# Patient Record
Sex: Male | Born: 1985 | Race: Black or African American | Hispanic: No | Marital: Married | Smoking: Current every day smoker
Health system: Southern US, Community
[De-identification: ages and names within clinical notes are randomized; demographics above are authoritative.]

---
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
## 2006-06-21 ENCOUNTER — Emergency Department (HOSPITAL_COMMUNITY): Admission: EM | Admit: 2006-06-21 | Discharge: 2006-06-22 | Payer: Self-pay | Admitting: Emergency Medicine
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
## 2008-10-21 ENCOUNTER — Emergency Department (HOSPITAL_COMMUNITY): Admission: EM | Admit: 2008-10-21 | Discharge: 2008-10-21 | Payer: Self-pay | Admitting: Family Medicine

## 2009-03-13 ENCOUNTER — Emergency Department (HOSPITAL_COMMUNITY): Admission: EM | Admit: 2009-03-13 | Discharge: 2009-03-13 | Payer: Self-pay | Admitting: Family Medicine

## 2009-08-22 ENCOUNTER — Emergency Department (HOSPITAL_COMMUNITY): Admission: EM | Admit: 2009-08-22 | Discharge: 2009-08-22 | Payer: Self-pay | Admitting: Emergency Medicine

## 2011-08-09 LAB — GC/CHLAMYDIA PROBE AMP, GENITAL: GC Probe Amp, Genital: POSITIVE — AB

## 2016-01-05 DIAGNOSIS — F1399 Sedative, hypnotic or anxiolytic use, unspecified with unspecified sedative, hypnotic or anxiolytic-induced disorder: Secondary | ICD-10-CM | POA: Insufficient documentation

## 2016-01-30 DIAGNOSIS — G4700 Insomnia, unspecified: Secondary | ICD-10-CM | POA: Insufficient documentation

## 2021-12-31 ENCOUNTER — Other Ambulatory Visit: Payer: Self-pay

## 2021-12-31 ENCOUNTER — Ambulatory Visit (INDEPENDENT_AMBULATORY_CARE_PROVIDER_SITE_OTHER): Admitting: Nurse Practitioner

## 2021-12-31 ENCOUNTER — Encounter: Payer: Self-pay | Admitting: Nurse Practitioner

## 2021-12-31 VITALS — BP 112/76 | HR 89 | Resp 16 | Ht 65.0 in | Wt 143.0 lb

## 2021-12-31 DIAGNOSIS — M62830 Muscle spasm of back: Secondary | ICD-10-CM | POA: Insufficient documentation

## 2021-12-31 DIAGNOSIS — Z7689 Persons encountering health services in other specified circumstances: Secondary | ICD-10-CM | POA: Diagnosis not present

## 2021-12-31 MED ORDER — TIZANIDINE HCL 4 MG PO TABS: 4.0000 mg | ORAL_TABLET | Freq: Four times a day (QID) | ORAL | 0 refills | Status: AC | PRN

## 2021-12-31 MED ORDER — CETIRIZINE HCL 10 MG PO TABS: 10.0000 mg | ORAL_TABLET | Freq: Every day | ORAL | 11 refills | Status: AC

## 2021-12-31 NOTE — Progress Notes (Signed)
@Patient  ID: , male    DOB: January 29, 1986, 36 y.o.   MRN: [id number]  Chief Complaint  Patient presents with   Establish Care    Referring provider: No ref. provider found   HPI  Patient presents today to establish care.  Patient states that he has a history of ADHD and has been on Adderall in the past.  Patient states that he has been out of this medication for about the past 6 months.  Patient states that he does have a new job and is finding it difficult to concentrate at work.  Patient does have a history of back spasms and lumbar DDD.  Patient needs refills on his prescription for tizanidine to have as needed for back spasms.  Patient does have a history of bilateral ear impactions.  He states that he would like to have his ears checked today.  Upon exam he does have impacted cerumen to bilateral ears.  Patient states that he cannot wait today to get a ear wash so he will return Wednesday this week to have this done as a nurse visit.  Denies f/c/s, n/v/d, hemoptysis, PND, chest pain or edema.    No Known Allergies   There is no immunization history on file for this patient.  History reviewed. No pertinent past medical history.  Tobacco History: Social History   Tobacco Use  Smoking Status Every Day   Types: Cigarettes  Smokeless Tobacco Never   Ready to quit: Not Answered Counseling given: Not Answered   Outpatient Encounter Medications as of 12/31/2021  Medication Sig   amphetamine-dextroamphetamine (ADDERALL XR) 30 MG 24 hr capsule dextroamphetamine-amphetamine 30 mg oral capsule, extended release Start Date: 07/16/19 Status: Ordered   cetirizine (ZYRTEC) 10 MG tablet Take 1 tablet (10 mg total) by mouth daily.   zolpidem (AMBIEN) 10 MG tablet zolpidem 10 mg oral tablet Start Date: 07/09/19 Status: Ordered   [DISCONTINUED] tiZANidine (ZANAFLEX) 4 MG tablet Take 4 mg by mouth every 6 (six) hours as needed.   tiZANidine (ZANAFLEX) 4 MG tablet Take 1  tablet (4 mg total) by mouth every 6 (six) hours as needed.   No facility-administered encounter medications on file as of 12/31/2021.     Review of Systems  Review of Systems  Constitutional: Negative.   HENT:  Positive for ear pain.   Cardiovascular: Negative.   Gastrointestinal: Negative.   Allergic/Immunologic: Negative.   Neurological: Negative.   Psychiatric/Behavioral: Negative.        Physical Exam  BP 112/76    Pulse 89    Resp 16    Ht 5\' 5"  (1.651 m)    Wt 143 lb (64.9 kg)    SpO2 97%    BMI 23.80 kg/m   Wt Readings from Last 5 Encounters:  12/31/21 143 lb (64.9 kg)     Physical Exam Vitals and nursing note reviewed.  Constitutional:      General: He is not in acute distress.    Appearance: He is well-developed.  Cardiovascular:     Rate and Rhythm: Normal rate and regular rhythm.  Pulmonary:     Effort: Pulmonary effort is normal.     Breath sounds: Normal breath sounds.  Skin:    General: Skin is warm and dry.  Neurological:     Mental Status: He is alert and oriented to person, place, and time.       Assessment & Plan:   Encounter to establish care Bilateral ear impaction ADHD Back spasm:  Meds ordered this encounter  Medications   tiZANidine (ZANAFLEX) 4 MG tablet    Sig: Take 1 tablet (4 mg total) by mouth every 6 (six) hours as needed.    Dispense:  30 tablet    Refill:  0   cetirizine (ZYRTEC) 10 MG tablet    Sig: Take 1 tablet (10 mg total) by mouth daily.    Dispense:  30 tablet    Refill:  11   Will order ear wash  Stay well hydrated  Stay active  Deep breathing exercises  May take tylenol or fever or pain   Follow up:  Follow up in 2 weeks for ADHD     Ivonne Andrew, NP 12/31/2021

## 2021-12-31 NOTE — Assessment & Plan Note (Signed)
Bilateral ear impaction ADHD Back spasm:  Meds ordered this encounter  Medications   tiZANidine (ZANAFLEX) 4 MG tablet    Sig: Take 1 tablet (4 mg total) by mouth every 6 (six) hours as needed.    Dispense:  30 tablet    Refill:  0   cetirizine (ZYRTEC) 10 MG tablet    Sig: Take 1 tablet (10 mg total) by mouth daily.    Dispense:  30 tablet    Refill:  11   Will order ear wash  Stay well hydrated  Stay active  Deep breathing exercises  May take tylenol or fever or pain   Follow up:  Follow up in 2 weeks for ADHD

## 2021-12-31 NOTE — Patient Instructions (Addendum)
Encounter to establish care Bilateral ear impaction ADHD Back spasm:  Meds ordered this encounter  Medications   tiZANidine (ZANAFLEX) 4 MG tablet    Sig: Take 1 tablet (4 mg total) by mouth every 6 (six) hours as needed.    Dispense:  30 tablet    Refill:  0   cetirizine (ZYRTEC) 10 MG tablet    Sig: Take 1 tablet (10 mg total) by mouth daily.    Dispense:  30 tablet    Refill:  11   Will order ear wash  Stay well hydrated  Stay active  Deep breathing exercises  May take tylenol or fever or pain   Follow up:  Follow up in 2 weeks for ADHD

## 2022-01-07 ENCOUNTER — Other Ambulatory Visit: Payer: Self-pay

## 2022-01-07 ENCOUNTER — Encounter: Payer: Self-pay | Admitting: Family Medicine

## 2022-01-07 ENCOUNTER — Ambulatory Visit (INDEPENDENT_AMBULATORY_CARE_PROVIDER_SITE_OTHER): Admitting: Family Medicine

## 2022-01-07 VITALS — BP 127/84 | HR 67 | Temp 98.2°F | Resp 16 | Ht 65.0 in | Wt 140.6 lb

## 2022-01-07 DIAGNOSIS — F411 Generalized anxiety disorder: Secondary | ICD-10-CM

## 2022-01-07 DIAGNOSIS — F988 Other specified behavioral and emotional disorders with onset usually occurring in childhood and adolescence: Secondary | ICD-10-CM

## 2022-01-07 DIAGNOSIS — F17210 Nicotine dependence, cigarettes, uncomplicated: Secondary | ICD-10-CM | POA: Diagnosis not present

## 2022-01-07 DIAGNOSIS — F17200 Nicotine dependence, unspecified, uncomplicated: Secondary | ICD-10-CM

## 2022-01-07 DIAGNOSIS — G4700 Insomnia, unspecified: Secondary | ICD-10-CM

## 2022-01-07 MED ORDER — TRAZODONE HCL 100 MG PO TABS: 100.0000 mg | ORAL_TABLET | Freq: Every evening | ORAL | 0 refills | Status: DC | PRN

## 2022-01-07 MED ORDER — AMPHETAMINE-DEXTROAMPHET ER 30 MG PO CP24: ORAL_CAPSULE | ORAL | 0 refills | Status: DC

## 2022-01-07 NOTE — Progress Notes (Signed)
Patient is here to est care with provider. Patient was recently in the [employer] and has since been D/C ? ?Patient said he has disc disease.  ? ?Patient would like a medication refill  ?

## 2022-01-08 ENCOUNTER — Encounter: Payer: Self-pay | Admitting: Family Medicine

## 2022-01-08 NOTE — Progress Notes (Signed)
? ?Established Patient Office Visit ? ?Subjective:  ?Patient ID: Chad Glenn, male    DOB: [birth year]  Age: 36 y.o. MRN: [id number] ? ?CC:  ?Chief Complaint  ?Patient presents with  ? Medication Refill  ? Depression  ? Ear Fullness  ? ? ?HPI ?Chad Glenn presents for review of chronic med issues including insomnia, anxiety and ADD.  ? ?No past medical history on file. ? ?No past surgical history on file. ? ?Family History  ?Problem Relation Age of Onset  ? Hypertension Mother   ? Hypertension Father   ? Heart failure Maternal Grandfather   ? Heart failure Paternal Grandmother   ? ? ?Social History  ? ?Socioeconomic History  ? Marital status: Married  ?  Spouse name: Not on file  ? Number of children: Not on file  ? Years of education: Not on file  ? Highest education level: Not on file  ?Occupational History  ? Not on file  ?Tobacco Use  ? Smoking status: Every Day  ?  Types: Cigarettes  ? Smokeless tobacco: Never  ?Vaping Use  ? Vaping Use: Never used  ?Substance and Sexual Activity  ? Alcohol use: Not Currently  ? Drug use: Not Currently  ? Sexual activity: Not on file  ?Other Topics Concern  ? Not on file  ?Social History Narrative  ? Not on file  ? ?Social Determinants of Health  ? ?Financial Resource Strain: Not on file  ?Food Insecurity: Not on file  ?Transportation Needs: Not on file  ?Physical Activity: Not on file  ?Stress: Not on file  ?Social Connections: Not on file  ?Intimate Partner Violence: Not on file  ? ? ?ROS ?Review of Systems  ?Psychiatric/Behavioral:  Positive for decreased concentration and sleep disturbance. Negative for self-injury and suicidal ideas. The patient is nervous/anxious.   ?All other systems reviewed and are negative. ? ?Objective:  ? ?Today's Vitals: BP 127/84   Pulse 67   Temp 98.2 ?F (36.8 ?C) (Oral)   Resp 16   Ht 5\' 5"  (1.651 m)   Wt 140 lb 9.6 oz (63.8 kg)   SpO2 98%   BMI 23.40 kg/m?  ? ?Physical Exam ?Vitals and nursing note reviewed.  ?Constitutional:   ?    General: He is not in acute distress. ?Cardiovascular:  ?   Rate and Rhythm: Normal rate and regular rhythm.  ?Pulmonary:  ?   Effort: Pulmonary effort is normal.  ?   Breath sounds: Normal breath sounds.  ?Abdominal:  ?   Palpations: Abdomen is soft.  ?   Tenderness: There is no abdominal tenderness.  ?Neurological:  ?   General: No focal deficit present.  ?   Mental Status: He is alert and oriented to person, place, and time.  ?Psychiatric:     ?   Mood and Affect: Affect normal. Mood is anxious.     ?   Speech: Speech is rapid and pressured.     ?   Behavior: Behavior is hyperactive. Behavior is cooperative.  ? ? ?Assessment & Plan:  ? ?1. Insomnia, unspecified type ?Trazodone prescribed. Good sleep hygiene discussed.  ? ?2. Attention deficit disorder, unspecified hyperactivity presence ?Adderall prescribed. Will try to obtain records from previous providers.  ? ?3. Generalized anxiety disorder ?Will monitor for now.  ? ?4. Smoking ?Discussed reduction/cessation ? ? ?Outpatient Encounter Medications as of 01/07/2022  ?Medication Sig  ? cetirizine (ZYRTEC) 10 MG tablet Take 1 tablet (10 mg total) by mouth daily.  ?  clonazePAM (KLONOPIN) 1 MG tablet clonazePAM 1 mg oral tablet ?Start Date: 05/21/19 ?Status: Ordered  ? tiZANidine (ZANAFLEX) 4 MG tablet Take 1 tablet (4 mg total) by mouth every 6 (six) hours as needed.  ? traZODone (DESYREL) 100 MG tablet Take 1 tablet (100 mg total) by mouth at bedtime as needed for sleep.  ? zolpidem (AMBIEN) 10 MG tablet zolpidem 10 mg oral tablet ?Start Date: 07/09/19 ?Status: Ordered  ? [DISCONTINUED] amphetamine-dextroamphetamine (ADDERALL XR) 30 MG 24 hr capsule dextroamphetamine-amphetamine 30 mg oral capsule, extended release ?Start Date: 07/16/19 ?Status: Ordered  ? amphetamine-dextroamphetamine (ADDERALL XR) 30 MG 24 hr capsule dextroamphetamine-amphetamine 30 mg oral capsule, extended release Start Date: 07/16/19 Status: Ordered  ? [DISCONTINUED] LORazepam (ATIVAN) 1 MG tablet  LORazepam 1 mg oral tablet ?Start Date: 04/23/19 ?Status: Ordered (Patient not taking: Reported on 01/07/2022)  ? ?No facility-administered encounter medications on file as of 01/07/2022.  ? ? ?Follow-up: No follow-ups on file.  ? ?Tommie Raymond, MD ? ?

## 2022-01-16 ENCOUNTER — Other Ambulatory Visit: Payer: Self-pay | Admitting: Family Medicine

## 2022-01-16 NOTE — Telephone Encounter (Signed)
amphetamine-dextroamphetamine (ADDERALL XR) 30 MG 24 hr capsule 30 capsule 0 01/07/2022    ?Sig: dextroamphetamine-amphetamine 30 mg oral capsule, extended release Start Date: 07/16/19 Status: Ordered   ?Sent to pharmacy as: amphetamine-dextroamphetamine (ADDERALL XR) 30 MG 24 hr capsule   ?Earliest Fill Date: 01/07/2022   ?E-Prescribing Status: Receipt confirmed by pharmacy (01/07/2022  3:39 PM    ? ?[id number] [place], [place] - [phone] AT [place] OF [facility] [address]  ?[address] [place] [address]  ?Phone: [phone] Fax: [phone]  ?Hours: Not open 24 hour  ?Pt has [facility] in and [facility], [facility] states has only 4 or so refills left and they will be out, please resend asap if at all possible  ?

## 2022-01-17 ENCOUNTER — Telehealth: Payer: Self-pay | Admitting: Family Medicine

## 2022-01-17 NOTE — Telephone Encounter (Signed)
?  Pt checking on Status of Adderral sent to  ? ?[facility] #[id number] [place], [place] - [phone] AT [facility] OF [facility] [address]  ?[address] [place] [address]  ?Phone: [phone] Fax: [phone]  ? ?

## 2022-01-18 NOTE — Telephone Encounter (Signed)
Pt was calling in to follow up on this Rx refill, pt wanted to know when his Adderall will be sent in to the new pharmacy.   ?

## 2022-01-23 ENCOUNTER — Encounter: Payer: Self-pay | Admitting: *Deleted

## 2022-01-29 ENCOUNTER — Other Ambulatory Visit: Payer: Self-pay | Admitting: Family Medicine

## 2022-01-29 ENCOUNTER — Telehealth: Payer: Self-pay | Admitting: Family Medicine

## 2022-01-29 MED ORDER — AMPHETAMINE-DEXTROAMPHET ER 30 MG PO CP24: ORAL_CAPSULE | ORAL | 0 refills | Status: DC

## 2022-01-29 NOTE — Telephone Encounter (Unsigned)
Copied from [place] [id number] [id number]. Topic: Quick Communication - Rx Refill/Question ?>> Jan 29, 2022  3:06 PM Yvette Rack wrote: ?Pt request Rx be sent to a pharmacy that has the medication because most locations are on backorder ?Medication: amphetamine-dextroamphetamine (ADDERALL XR) 30 MG 24 hr capsule ? ?Has the patient contacted their pharmacy? Yes.   ?(Agent: If no, request that the patient contact the pharmacy for the refill. If patient does not wish to contact the pharmacy document the reason why and proceed with request.) ?(Agent: If yes, when and what did the pharmacy advise?) ? ?Preferred Pharmacy (with phone number or street name): [id number] [place], [place]. AT [place]  ?Phone: [phone] ?Fax: [phone] ? ?Has the patient been seen for an appointment in the last year OR does the patient have an upcoming appointment? Yes.   ? ?Agent: Please be advised that RX refills may take up to 3 business days. We ask that you follow-up with your pharmacy. ?

## 2022-01-30 ENCOUNTER — Other Ambulatory Visit: Payer: Self-pay | Admitting: Family Medicine

## 2022-01-30 MED ORDER — AMPHETAMINE-DEXTROAMPHET ER 30 MG PO CP24: ORAL_CAPSULE | ORAL | 0 refills | Status: DC

## 2022-02-07 ENCOUNTER — Ambulatory Visit (INDEPENDENT_AMBULATORY_CARE_PROVIDER_SITE_OTHER): Admitting: Family Medicine

## 2022-02-07 ENCOUNTER — Encounter: Payer: Self-pay | Admitting: Family Medicine

## 2022-02-07 ENCOUNTER — Ambulatory Visit: Payer: Self-pay | Admitting: Family

## 2022-02-07 VITALS — BP 120/80 | HR 81 | Temp 98.0°F | Resp 16 | Wt 136.4 lb

## 2022-02-07 DIAGNOSIS — F411 Generalized anxiety disorder: Secondary | ICD-10-CM

## 2022-02-07 DIAGNOSIS — F988 Other specified behavioral and emotional disorders with onset usually occurring in childhood and adolescence: Secondary | ICD-10-CM

## 2022-02-07 DIAGNOSIS — G4700 Insomnia, unspecified: Secondary | ICD-10-CM | POA: Diagnosis not present

## 2022-02-07 MED ORDER — TRAZODONE HCL 100 MG PO TABS: 100.0000 mg | ORAL_TABLET | Freq: Every evening | ORAL | 1 refills | Status: DC | PRN

## 2022-02-07 NOTE — Progress Notes (Signed)
? ?Established Patient Office Visit ? ?Subjective:  ?Patient ID: COLA HIGHFILL, male    DOB: 01-07-1986  Age: 36 y.o. MRN: [id number] ? ?CC:  ?Chief Complaint  ?Patient presents with  ? Follow-up  ?  3 month follow  ? ? ?HPI ?Blair Dolphin presents for follow up of ADHD and insomnia. He reports that both are improved with the present management. He denies acute complaints or concerns.  ? ?No past medical history on file. ? ?No past surgical history on file. ? ?Family History  ?Problem Relation Age of Onset  ? Hypertension Mother   ? Hypertension Father   ? Heart failure Maternal Grandfather   ? Heart failure Paternal Grandmother   ? ? ?Social History  ? ?Socioeconomic History  ? Marital status: Married  ?  Spouse name: Not on file  ? Number of children: Not on file  ? Years of education: Not on file  ? Highest education level: Not on file  ?Occupational History  ? Not on file  ?Tobacco Use  ? Smoking status: Every Day  ?  Types: Cigarettes  ? Smokeless tobacco: Never  ?Vaping Use  ? Vaping Use: Never used  ?Substance and Sexual Activity  ? Alcohol use: Not Currently  ? Drug use: Not Currently  ? Sexual activity: Not on file  ?Other Topics Concern  ? Not on file  ?Social History Narrative  ? Not on file  ? ?Social Determinants of Health  ? ?Financial Resource Strain: Not on file  ?Food Insecurity: Not on file  ?Transportation Needs: Not on file  ?Physical Activity: Not on file  ?Stress: Not on file  ?Social Connections: Not on file  ?Intimate Partner Violence: Not on file  ? ? ?ROS ?Review of Systems  ?All other systems reviewed and are negative. ? ?Objective:  ? ?Today's Vitals: BP 120/80   Pulse 81   Temp 98 ?F (36.7 ?C) (Oral)   Resp 16   Wt 136 lb 6.4 oz (61.9 kg)   SpO2 98%   BMI 22.70 kg/m?  ? ?Physical Exam ?Vitals and nursing note reviewed.  ?Constitutional:   ?   General: He is not in acute distress. ?Cardiovascular:  ?   Rate and Rhythm: Normal rate and regular rhythm.  ?Pulmonary:  ?   Effort:  Pulmonary effort is normal.  ?   Breath sounds: Normal breath sounds.  ?Abdominal:  ?   Palpations: Abdomen is soft.  ?   Tenderness: There is no abdominal tenderness.  ?Neurological:  ?   General: No focal deficit present.  ?   Mental Status: He is alert and oriented to person, place, and time.  ?Psychiatric:     ?   Mood and Affect: Mood and affect normal.     ?   Speech: Speech normal.     ?   Behavior: Behavior normal. Behavior is not hyperactive. Behavior is cooperative.  ? ? ?Assessment & Plan:  ? ?1. Insomnia, unspecified type ?Improved. Continue present management. Meds refilled.  ? ?2. Attention deficit disorder, unspecified hyperactivity presence ?Appears stable with present management. Continue and monitor ? ?3. Generalized anxiety disorder ?Appears stable. Continue and monitor ? ? ? ?Outpatient Encounter Medications as of 02/07/2022  ?Medication Sig  ? amphetamine-dextroamphetamine (ADDERALL XR) 30 MG 24 hr capsule dextroamphetamine-amphetamine 30 mg oral capsule, extended release Start Date: 07/16/19 Status: Ordered  ? cetirizine (ZYRTEC) 10 MG tablet Take 1 tablet (10 mg total) by mouth daily.  ? clonazePAM (KLONOPIN) 1  MG tablet clonazePAM 1 mg oral tablet ?Start Date: 05/21/19 ?Status: Ordered  ? zolpidem (AMBIEN) 10 MG tablet zolpidem 10 mg oral tablet ?Start Date: 07/09/19 ?Status: Ordered  ? [DISCONTINUED] traZODone (DESYREL) 100 MG tablet Take 1 tablet (100 mg total) by mouth at bedtime as needed for sleep.  ? traZODone (DESYREL) 100 MG tablet Take 1 tablet (100 mg total) by mouth at bedtime as needed for sleep.  ? ?No facility-administered encounter medications on file as of 02/07/2022.  ? ? ?Follow-up: Return in about 4 months (around 06/09/2022) for follow up.  ? ?Tommie Raymond, MD ? ?

## 2022-02-13 ENCOUNTER — Other Ambulatory Visit: Payer: Self-pay | Admitting: Nurse Practitioner

## 2022-02-13 DIAGNOSIS — M62830 Muscle spasm of back: Secondary | ICD-10-CM

## 2022-03-01 ENCOUNTER — Other Ambulatory Visit: Payer: Self-pay | Admitting: Family Medicine

## 2022-03-04 MED ORDER — AMPHETAMINE-DEXTROAMPHET ER 30 MG PO CP24: ORAL_CAPSULE | ORAL | 0 refills | Status: DC

## 2022-04-08 ENCOUNTER — Other Ambulatory Visit: Payer: Self-pay | Admitting: Family Medicine

## 2022-04-08 DIAGNOSIS — F988 Other specified behavioral and emotional disorders with onset usually occurring in childhood and adolescence: Secondary | ICD-10-CM

## 2022-04-08 MED ORDER — AMPHETAMINE-DEXTROAMPHET ER 30 MG PO CP24: ORAL_CAPSULE | ORAL | 0 refills | Status: DC

## 2022-04-08 NOTE — Telephone Encounter (Signed)
Refilled per patient request. 

## 2022-05-13 ENCOUNTER — Ambulatory Visit (INDEPENDENT_AMBULATORY_CARE_PROVIDER_SITE_OTHER): Admitting: Family Medicine

## 2022-05-13 ENCOUNTER — Encounter: Payer: Self-pay | Admitting: Family Medicine

## 2022-05-13 VITALS — BP 116/74 | HR 89 | Temp 98.0°F | Resp 16 | Wt 129.0 lb

## 2022-05-13 DIAGNOSIS — Z7189 Other specified counseling: Secondary | ICD-10-CM | POA: Insufficient documentation

## 2022-05-13 DIAGNOSIS — L309 Dermatitis, unspecified: Secondary | ICD-10-CM | POA: Diagnosis not present

## 2022-05-13 DIAGNOSIS — M7650 Patellar tendinitis, unspecified knee: Secondary | ICD-10-CM | POA: Insufficient documentation

## 2022-05-13 DIAGNOSIS — Z202 Contact with and (suspected) exposure to infections with a predominantly sexual mode of transmission: Secondary | ICD-10-CM | POA: Insufficient documentation

## 2022-05-13 DIAGNOSIS — IMO0001 Reserved for inherently not codable concepts without codable children: Secondary | ICD-10-CM | POA: Insufficient documentation

## 2022-05-13 DIAGNOSIS — M25569 Pain in unspecified knee: Secondary | ICD-10-CM | POA: Insufficient documentation

## 2022-05-13 DIAGNOSIS — H52229 Regular astigmatism, unspecified eye: Secondary | ICD-10-CM | POA: Insufficient documentation

## 2022-05-13 DIAGNOSIS — G4700 Insomnia, unspecified: Secondary | ICD-10-CM | POA: Diagnosis not present

## 2022-05-13 DIAGNOSIS — Z569 Unspecified problems related to employment: Secondary | ICD-10-CM | POA: Insufficient documentation

## 2022-05-13 DIAGNOSIS — F988 Other specified behavioral and emotional disorders with onset usually occurring in childhood and adolescence: Secondary | ICD-10-CM

## 2022-05-13 DIAGNOSIS — Z0289 Encounter for other administrative examinations: Secondary | ICD-10-CM | POA: Insufficient documentation

## 2022-05-13 DIAGNOSIS — M62830 Muscle spasm of back: Secondary | ICD-10-CM

## 2022-05-13 DIAGNOSIS — J310 Chronic rhinitis: Secondary | ICD-10-CM | POA: Insufficient documentation

## 2022-05-13 DIAGNOSIS — Z0110 Encounter for examination of ears and hearing without abnormal findings: Secondary | ICD-10-CM | POA: Insufficient documentation

## 2022-05-13 DIAGNOSIS — F4325 Adjustment disorder with mixed disturbance of emotions and conduct: Secondary | ICD-10-CM | POA: Insufficient documentation

## 2022-05-13 DIAGNOSIS — Z638 Other specified problems related to primary support group: Secondary | ICD-10-CM | POA: Insufficient documentation

## 2022-05-13 DIAGNOSIS — Z5689 Other problems related to employment: Secondary | ICD-10-CM | POA: Insufficient documentation

## 2022-05-13 MED ORDER — TIZANIDINE HCL 4 MG PO TABS: 4.0000 mg | ORAL_TABLET | Freq: Four times a day (QID) | ORAL | 3 refills | Status: AC | PRN

## 2022-05-13 MED ORDER — AMPHETAMINE-DEXTROAMPHET ER 30 MG PO CP24: ORAL_CAPSULE | ORAL | 0 refills | Status: DC

## 2022-05-13 MED ORDER — TRIAMCINOLONE ACETONIDE 0.025 % EX OINT: TOPICAL_OINTMENT | Freq: Two times a day (BID) | CUTANEOUS | 3 refills | Status: AC

## 2022-05-14 NOTE — Progress Notes (Unsigned)
Established Patient Office Visit  Subjective    Patient ID: Chad Glenn, male    DOB: Mar 21, 1986  Age: 36 y.o. MRN: [id number]  CC:  Chief Complaint  Patient presents with   Eczema    HPI Chad Glenn presents to establish care   Outpatient Encounter Medications as of 05/13/2022  Medication Sig   cetirizine (ZYRTEC) 10 MG tablet Take 1 tablet (10 mg total) by mouth daily.   clonazePAM (KLONOPIN) 1 MG tablet clonazePAM 1 mg oral tablet Start Date: 05/21/19 Status: Ordered   traZODone (DESYREL) 100 MG tablet Take 1 tablet (100 mg total) by mouth at bedtime as needed for sleep.   triamcinolone oint 0.025%-Cerave equivalent cream 1:1 mixture Apply topically 2 (two) times daily.   zolpidem (AMBIEN) 10 MG tablet zolpidem 10 mg oral tablet Start Date: 07/09/19 Status: Ordered   [DISCONTINUED] amphetamine-dextroamphetamine (ADDERALL XR) 30 MG 24 hr capsule dextroamphetamine-amphetamine 30 mg oral capsule, extended release Start Date: 07/16/19 Status: Ordered   amphetamine-dextroamphetamine (ADDERALL XR) 30 MG 24 hr capsule dextroamphetamine-amphetamine 30 mg oral capsule, extended release Start Date: 07/16/19 Status: Ordered   tiZANidine (ZANAFLEX) 4 MG tablet Take 1 tablet (4 mg total) by mouth every 6 (six) hours as needed.   No facility-administered encounter medications on file as of 05/13/2022.    History reviewed. No pertinent past medical history.  History reviewed. No pertinent surgical history.  Family History  Problem Relation Age of Onset   Hypertension Mother    Hypertension Father    Heart failure Maternal Grandfather    Heart failure Paternal Grandmother     Social History   Socioeconomic History   Marital status: Married    Spouse name: Not on file   Number of children: Not on file   Years of education: Not on file   Highest education level: Not on file  Occupational History   Not on file  Tobacco Use   Smoking status: Every Day    Types: Cigarettes    Smokeless tobacco: Never  Vaping Use   Vaping Use: Never used  Substance and Sexual Activity   Alcohol use: Not Currently   Drug use: Not Currently   Sexual activity: Not on file  Other Topics Concern   Not on file  Social History Narrative   Not on file   Social Determinants of Health   Financial Resource Strain: Not on file  Food Insecurity: Not on file  Transportation Needs: Not on file  Physical Activity: Not on file  Stress: Not on file  Social Connections: Not on file  Intimate Partner Violence: Not on file    ROS      Objective    BP 116/74   Pulse 89   Temp 98 F (36.7 C) (Oral)   Resp 16   Wt 129 lb (58.5 kg)   SpO2 96%   BMI 21.47 kg/m   Physical Exam  {Labs (Optional):23779}    Assessment & Plan:   Problem List Items Addressed This Visit       Other   Back muscle spasm - Primary   Insomnia, unspecified   Other Visit Diagnoses     Attention deficit disorder, unspecified hyperactivity presence       Relevant Medications   amphetamine-dextroamphetamine (ADDERALL XR) 30 MG 24 hr capsule   Eczema, unspecified type           Return in about 3 months (around 08/13/2022) for follow up.   Tommie Raymond, MD

## 2022-05-15 ENCOUNTER — Other Ambulatory Visit: Payer: Self-pay | Admitting: Family Medicine

## 2022-05-15 DIAGNOSIS — F988 Other specified behavioral and emotional disorders with onset usually occurring in childhood and adolescence: Secondary | ICD-10-CM

## 2022-05-15 NOTE — Telephone Encounter (Signed)
Copied from CRM [id number]. Topic: General - Other >> May 15, 2022 12:35 PM Everette C wrote: Reason for CRM: Medication Refill - Medication: amphetamine-dextroamphetamine (ADDERALL XR) 30 MG 24 hr capsule [[id number]]   triamcinolone oint 0.025%-Cerave equivalent cream 1:1 mixture [[id number]]   Has the patient contacted their pharmacy? Yes.  The patient's pharmacy has had technical issues  (Agent: If no, request that the patient contact the pharmacy for the refill. If patient does not wish to contact the pharmacy document the reason why and proceed with request.) (Agent: If yes, when and what did the pharmacy advise?)  Preferred Pharmacy (with phone number or street name[id number] [place], [place] - [address] AT CORNER OF [facility] [address] [place] [address] Phone: [phone] Fax: [phone] Hours: Not open 24 hours   Has the patient been seen for an appointment in the last year OR does the patient have an upcoming appointment? Yes.    Agent: Please be advised that RX refills may take up to 3 business days. We ask that you follow-up with your pharmacy.

## 2022-05-15 NOTE — Telephone Encounter (Signed)
Receipt confirmed by pharmacy 05/13/22  At 5:56 Requested Prescriptions  Pending Prescriptions Disp Refills  . amphetamine-dextroamphetamine (ADDERALL XR) 30 MG 24 hr capsule 30 capsule 0    Sig: dextroamphetamine-amphetamine 30 mg oral capsule, extended release Start Date: 07/16/19 Status: Ordered     Not Delegated - Psychiatry:  Stimulants/ADHD Failed - 05/15/2022  2:04 PM      Failed - This refill cannot be delegated      Failed - Urine Drug Screen completed in last 360 days      Passed - Last BP in normal range    BP Readings from Last 1 Encounters:  05/13/22 116/74         Passed - Last Heart Rate in normal range    Pulse Readings from Last 1 Encounters:  05/13/22 89         Passed - Valid encounter within last 6 months    Recent Outpatient Visits          2 days ago Insomnia, unspecified type   Primary Care at [facility], MD   3 months ago Insomnia, unspecified type   Primary [facility] at [facility], [place]   4 months ago Insomnia, unspecified type   Primary Care at [facility], Lauris Poag, MD   4 months ago Back muscle spasm   Primary Care at [facility], Archie Patten S, NP             . triamcinolone oint 0.025%-Cerave equivalent cream 1:1 mixture 454 g 3    Sig: Apply topically 2 (two) times daily.     This medication is a mixture and was not evaluated by refill protocols.

## 2022-05-15 NOTE — Telephone Encounter (Signed)
Requested medication (s) are due for refill today: For review  Requested medication (s) are on the active medication list: yes    Last refill: 05/13/22  #30  0 refill  Future visit scheduled no  Notes to clinic:Transmission to pharmacy failed, please resend  Requested Prescriptions  Pending Prescriptions Disp Refills   amphetamine-dextroamphetamine (ADDERALL XR) 30 MG 24 hr capsule 30 capsule 0    Sig: dextroamphetamine-amphetamine 30 mg oral capsule, extended release Start Date: 07/16/19 Status: Ordered     Not Delegated - Psychiatry:  Stimulants/ADHD Failed - 05/15/2022  2:04 PM      Failed - This refill cannot be delegated      Failed - Urine Drug Screen completed in last 360 days      Passed - Last BP in normal range    BP Readings from Last 1 Encounters:  05/13/22 116/74         Passed - Last Heart Rate in normal range    Pulse Readings from Last 1 Encounters:  05/13/22 89         Passed - Valid encounter within last 6 months    Recent Outpatient Visits           2 days ago Insomnia, unspecified type   [facility] at [facility], MD   3 months ago Insomnia, unspecified type   Primary [facility] at [facility], MD   4 months ago Insomnia, unspecified type   Primary Care at [facility], Lauris Poag, MD   4 months ago Back muscle spasm   Primary Care at [facility], Gildardo Pounds, NP              Refused Prescriptions Disp Refills   triamcinolone oint 0.025%-Cerave equivalent cream 1:1 mixture 454 g 3    Sig: Apply topically 2 (two) times daily.     This medication is a mixture and was not evaluated by refill protocols.

## 2022-05-16 ENCOUNTER — Other Ambulatory Visit: Payer: Self-pay | Admitting: Family Medicine

## 2022-05-20 ENCOUNTER — Other Ambulatory Visit: Payer: Self-pay | Admitting: Family Medicine

## 2022-05-24 NOTE — Telephone Encounter (Signed)
Patient checking status of refill request patient states being w/o his amphetamine-dextroamphetamine (ADDERALL XR) 30 MG 24 hr capsule   is starting to affect his job performance  Please advise

## 2022-05-27 ENCOUNTER — Other Ambulatory Visit: Payer: Self-pay | Admitting: Family Medicine

## 2022-05-27 DIAGNOSIS — F988 Other specified behavioral and emotional disorders with onset usually occurring in childhood and adolescence: Secondary | ICD-10-CM

## 2022-06-01 MED ORDER — AMPHETAMINE-DEXTROAMPHET ER 30 MG PO CP24: ORAL_CAPSULE | ORAL | 0 refills | Status: DC

## 2022-07-04 ENCOUNTER — Other Ambulatory Visit: Payer: Self-pay | Admitting: Family Medicine

## 2022-07-04 DIAGNOSIS — F988 Other specified behavioral and emotional disorders with onset usually occurring in childhood and adolescence: Secondary | ICD-10-CM

## 2022-07-10 ENCOUNTER — Other Ambulatory Visit: Payer: Self-pay | Admitting: Family Medicine

## 2022-07-10 DIAGNOSIS — F988 Other specified behavioral and emotional disorders with onset usually occurring in childhood and adolescence: Secondary | ICD-10-CM

## 2022-07-10 NOTE — Telephone Encounter (Signed)
Medication Refill - Medication: amphetamine-dextroamphetamine (ADDERALL XR) 30 MG 24 hr capsule  Has the patient contacted their pharmacy? No.  Mychart refill request on 8-31   Preferred Pharmacy (with phone number or street name):  [id number] [place], [place] - [address] AT [address] [facility] [address] Phone:  [phone]  Fax:  [phone]     Has the patient been seen for an appointment in the last year OR does the patient have an upcoming appointment? Yes.    Agent: Please be advised that RX refills may take up to 3 business days. We ask that you follow-up with your pharmacy.

## 2022-07-11 NOTE — Telephone Encounter (Signed)
Requested medication (s) are due for refill today:   Provider to review  Requested medication (s) are on the active medication list:   Yes  Future visit scheduled:   No   Seen a month ago   Last ordered: 06/01/2022 #30, 0 refills  Non delegated refill reason returned   Requested Prescriptions  Pending Prescriptions Disp Refills   amphetamine-dextroamphetamine (ADDERALL XR) 30 MG 24 hr capsule 30 capsule 0    Sig: dextroamphetamine-amphetamine 30 mg oral capsule, extended release Start Date: 07/16/19 Status: Ordered     Not Delegated - Psychiatry:  Stimulants/ADHD Failed - 07/10/2022  4:02 PM      Failed - This refill cannot be delegated      Failed - Urine Drug Screen completed in last 360 days      Passed - Last BP in normal range    BP Readings from Last 1 Encounters:  05/13/22 116/74         Passed - Last Heart Rate in normal range    Pulse Readings from Last 1 Encounters:  05/13/22 89         Passed - Valid encounter within last 6 months    Recent Outpatient Visits           1 month ago Insomnia, unspecified type   [facility] at [facility], MD   5 months ago Insomnia, unspecified type   Primary Care at [facility], MD   6 months ago Insomnia, unspecified type   Primary Care at [facility], MD   6 months ago Back muscle spasm   Primary Care at [facility], Gildardo Pounds, NP

## 2022-07-15 MED ORDER — AMPHETAMINE-DEXTROAMPHET ER 30 MG PO CP24: ORAL_CAPSULE | ORAL | 0 refills | Status: DC

## 2022-08-14 ENCOUNTER — Encounter: Payer: Self-pay | Admitting: Family Medicine

## 2022-08-14 ENCOUNTER — Ambulatory Visit (INDEPENDENT_AMBULATORY_CARE_PROVIDER_SITE_OTHER): Admitting: Family Medicine

## 2022-08-14 VITALS — BP 114/84 | HR 93 | Temp 97.7°F | Resp 16 | Wt 126.2 lb

## 2022-08-14 DIAGNOSIS — M79604 Pain in right leg: Secondary | ICD-10-CM
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
## 2022-08-15 ENCOUNTER — Other Ambulatory Visit: Payer: Self-pay | Admitting: Family Medicine

## 2022-08-15 DIAGNOSIS — F988 Other specified behavioral and emotional disorders with onset usually occurring in childhood and adolescence: Secondary | ICD-10-CM

## 2022-08-15 MED ORDER — AMPHETAMINE-DEXTROAMPHET ER 30 MG PO CP24: ORAL_CAPSULE | ORAL | 0 refills | Status: DC

## 2022-09-27 ENCOUNTER — Other Ambulatory Visit: Payer: Self-pay | Admitting: Family Medicine

## 2022-09-27 DIAGNOSIS — F988 Other specified behavioral and emotional disorders with onset usually occurring in childhood and adolescence: Secondary | ICD-10-CM

## 2022-09-30 MED ORDER — AMPHETAMINE-DEXTROAMPHET ER 30 MG PO CP24: ORAL_CAPSULE | ORAL | 0 refills | Status: DC

## 2022-10-15 ENCOUNTER — Ambulatory Visit: Admitting: Family Medicine

## 2022-10-23 ENCOUNTER — Ambulatory Visit: Admitting: Orthopaedic Surgery
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
## 2022-12-04 ENCOUNTER — Encounter: Payer: Self-pay | Admitting: Family Medicine

## 2022-12-04 ENCOUNTER — Ambulatory Visit (INDEPENDENT_AMBULATORY_CARE_PROVIDER_SITE_OTHER): Admitting: Family Medicine

## 2022-12-04 VITALS — BP 110/72 | HR 93 | Temp 98.1°F | Resp 16 | Wt 133.2 lb

## 2022-12-04 DIAGNOSIS — F988 Other specified behavioral and emotional disorders with onset usually occurring in childhood and adolescence: Secondary | ICD-10-CM

## 2022-12-04 MED ORDER — AMPHETAMINE-DEXTROAMPHET ER 30 MG PO CP24: ORAL_CAPSULE | ORAL | 0 refills | Status: DC

## 2022-12-05 ENCOUNTER — Encounter: Payer: Self-pay | Admitting: Family Medicine

## 2022-12-05 ENCOUNTER — Telehealth: Payer: Self-pay | Admitting: *Deleted

## 2022-12-05 NOTE — Progress Notes (Signed)
Established Patient Office Visit  Subjective    Patient ID: Chad Glenn, male    DOB: 11/25/1985  Age: 37 y.o. MRN: [id number]  CC:  Chief Complaint  Patient presents with   Follow-up   Medication Refill    HPI Chad Glenn presents for follow up of ADHD. Patient reports doing well with present management. Patient denies acute complaints or concerns.    Outpatient Encounter Medications as of 12/04/2022  Medication Sig   cetirizine (ZYRTEC) 10 MG tablet Take 1 tablet (10 mg total) by mouth daily.   clonazePAM (KLONOPIN) 1 MG tablet clonazePAM 1 mg oral tablet Start Date: 05/21/19 Status: Ordered   tiZANidine (ZANAFLEX) 4 MG tablet Take 1 tablet (4 mg total) by mouth every 6 (six) hours as needed.   traZODone (DESYREL) 100 MG tablet Take 1 tablet (100 mg total) by mouth at bedtime as needed for sleep.   triamcinolone oint 0.025%-Cerave equivalent cream 1:1 mixture Apply topically 2 (two) times daily.   zolpidem (AMBIEN) 10 MG tablet zolpidem 10 mg oral tablet Start Date: 07/09/19 Status: Ordered   [DISCONTINUED] amphetamine-dextroamphetamine (ADDERALL XR) 30 MG 24 hr capsule dextroamphetamine-amphetamine 30 mg oral capsule, extended release Start Date: 07/16/19 Status: Ordered   amphetamine-dextroamphetamine (ADDERALL XR) 30 MG 24 hr capsule dextroamphetamine-amphetamine 30 mg oral capsule, extended release Start Date: 07/16/19 Status: Ordered   [DISCONTINUED] amphetamine-dextroamphetamine (ADDERALL XR) 30 MG 24 hr capsule dextroamphetamine-amphetamine 30 mg oral capsule, extended release Start Date: 07/16/19 Status: Ordered   No facility-administered encounter medications on file as of 12/04/2022.    History reviewed. No pertinent past medical history.  History reviewed. No pertinent surgical history.  Family History  Problem Relation Age of Onset   Hypertension Mother    Hypertension Father    Heart failure Maternal Grandfather    Heart failure Paternal Grandmother      Social History   Socioeconomic History   Marital status: Married    Spouse name: Not on file   Number of children: Not on file   Years of education: Not on file   Highest education level: Not on file  Occupational History   Not on file  Tobacco Use   Smoking status: Every Day    Types: Cigarettes   Smokeless tobacco: Never  Vaping Use   Vaping Use: Never used  Substance and Sexual Activity   Alcohol use: Not Currently   Drug use: Not Currently   Sexual activity: Not on file  Other Topics Concern   Not on file  Social History Narrative   Not on file   Social Determinants of Health   Financial Resource Strain: Not on file  Food Insecurity: Not on file  Transportation Needs: Not on file  Physical Activity: Not on file  Stress: Not on file  Social Connections: Not on file  Intimate Partner Violence: Not on file    Review of Systems  All other systems reviewed and are negative.       Objective    BP 110/72   Pulse 93   Temp 98.1 F (36.7 C) (Oral)   Resp 16   Wt 133 lb 3.2 oz (60.4 kg)   SpO2 94%   BMI 22.17 kg/m   Physical Exam Vitals and nursing note reviewed.  Constitutional:      General: He is not in acute distress. Cardiovascular:     Rate and Rhythm: Normal rate and regular rhythm.  Pulmonary:     Effort: Pulmonary effort is normal.  Breath sounds: Normal breath sounds.  Abdominal:     Palpations: Abdomen is soft.     Tenderness: There is no abdominal tenderness.  Neurological:     General: No focal deficit present.     Mental Status: He is alert and oriented to person, place, and time.  Psychiatric:        Mood and Affect: Mood and affect normal.        Speech: Speech normal.        Behavior: Behavior normal. Behavior is not hyperactive. Behavior is cooperative.         Assessment & Plan:   1. Attention deficit disorder, unspecified hyperactivity presence Med refilled.  - amphetamine-dextroamphetamine (ADDERALL XR) 30 MG  24 hr capsule; dextroamphetamine-amphetamine 30 mg oral capsule, extended release Start Date: 07/16/19 Status: Ordered  Dispense: 30 capsule; Refill: 0    Return in about 3 months (around 03/04/2023) for follow up.   Becky Sax, MD

## 2022-12-05 NOTE — Telephone Encounter (Signed)
Pt at pharmacy to pick up Adderall, no instructions with prescription.  Please advise

## 2022-12-06 ENCOUNTER — Other Ambulatory Visit: Payer: Self-pay | Admitting: Family Medicine

## 2022-12-06 DIAGNOSIS — F988 Other specified behavioral and emotional disorders with onset usually occurring in childhood and adolescence: Secondary | ICD-10-CM

## 2022-12-06 MED ORDER — AMPHETAMINE-DEXTROAMPHET ER 30 MG PO CP24: 30.0000 mg | ORAL_CAPSULE | Freq: Every day | ORAL | 0 refills | Status: DC

## 2022-12-06 NOTE — Telephone Encounter (Signed)
Pt is calling back to check on his medication. Per patient there was not instructions sent in with the prescription and the pharmacy cannot fill the prescription. Please advise.

## 2022-12-09 ENCOUNTER — Other Ambulatory Visit: Payer: Self-pay | Admitting: Family Medicine

## 2022-12-09 NOTE — Telephone Encounter (Signed)
Spoke with Latoya at [facility] on 12/09/2022 regarding patient medication   amphetamine-dextroamphetamine (ADDERALL XR) 30 MG 24 hr capsule   Per Latoya,  medication has been picked by patient.

## 2023-01-02 ENCOUNTER — Other Ambulatory Visit: Payer: Self-pay | Admitting: Family Medicine

## 2023-01-02 DIAGNOSIS — F988 Other specified behavioral and emotional disorders with onset usually occurring in childhood and adolescence: Secondary | ICD-10-CM

## 2023-01-02 MED ORDER — AMPHETAMINE-DEXTROAMPHET ER 30 MG PO CP24: 30.0000 mg | ORAL_CAPSULE | Freq: Every day | ORAL | 0 refills | Status: DC

## 2023-01-29 ENCOUNTER — Other Ambulatory Visit: Payer: Self-pay | Admitting: Family Medicine

## 2023-01-29 ENCOUNTER — Ambulatory Visit: Admitting: Family Medicine

## 2023-01-29 DIAGNOSIS — F988 Other specified behavioral and emotional disorders with onset usually occurring in childhood and adolescence: Secondary | ICD-10-CM

## 2023-01-29 NOTE — Telephone Encounter (Signed)
Medication Refill - Medication: amphetamine-dextroamphetamine (ADDERALL XR) 30 MG 24 hr capsule   Has the patient contacted their pharmacy? No. No, more refills   (Agent: If no, request that the patient contact the pharmacy for the refill. If patient does not wish to contact the pharmacy document the reason why and proceed with request.)  Preferred Pharmacy (with phone number or street name):  [facility] ([address]), [place] - [address]  [id number] [place] ([place]) [place] [address]  Phone: [phone] Fax: [phone]  Hours: Not open 24 hours   Has the patient been seen for an appointment in the last year OR does the patient have an upcoming appointment? Yes.    Agent: Please be advised that RX refills may take up to 3 business days. We ask that you follow-up with your pharmacy.

## 2023-01-30 ENCOUNTER — Other Ambulatory Visit: Payer: Self-pay | Admitting: Family Medicine

## 2023-01-30 DIAGNOSIS — F988 Other specified behavioral and emotional disorders with onset usually occurring in childhood and adolescence: Secondary | ICD-10-CM

## 2023-01-30 NOTE — Telephone Encounter (Signed)
Requested medication (s) are due for refill today: yes  Requested medication (s) are on the active medication list: yes  Last refill:  01/02/23  Future visit scheduled: yes  Notes to clinic:  Unable to refill per protocol, cannot delegate.      Requested Prescriptions  Pending Prescriptions Disp Refills   amphetamine-dextroamphetamine (ADDERALL XR) 30 MG 24 hr capsule 30 capsule 0    Sig: Take 1 capsule (30 mg total) by mouth daily. dextroamphetamine-amphetamine 30 mg oral capsule, extended release Start Date: 07/16/19 Status: Ordered     Not Delegated - Psychiatry:  Stimulants/ADHD Failed - 01/29/2023  2:29 PM      Failed - This refill cannot be delegated      Failed - Urine Drug Screen completed in last 360 days      Passed - Last BP in normal range    BP Readings from Last 1 Encounters:  12/04/22 110/72         Passed - Last Heart Rate in normal range    Pulse Readings from Last 1 Encounters:  12/04/22 93         Passed - Valid encounter within last 6 months    Recent Outpatient Visits           1 month ago Attention deficit disorder, unspecified hyperactivity presence   [place] [facility] at [facility], MD   5 months ago Right leg pain   [place] [facility] at [facility], MD   8 months ago Insomnia, unspecified type   [place] [facility] at [facility], MD   11 months ago Insomnia, unspecified type   [place] [facility] at [facility], MD   1 year ago Insomnia, unspecified type   [place] [facility] at [facility], MD       Future Appointments             In 4 weeks Dorna Mai, MD [place] at [facility]

## 2023-01-31 MED ORDER — AMPHETAMINE-DEXTROAMPHET ER 30 MG PO CP24: 30.0000 mg | ORAL_CAPSULE | Freq: Every day | ORAL | 0 refills | Status: DC

## 2023-02-03 DIAGNOSIS — Z419 Encounter for procedure for purposes other than remedying health state, unspecified: Secondary | ICD-10-CM | POA: Diagnosis not present

## 2023-02-26 ENCOUNTER — Other Ambulatory Visit: Payer: Self-pay | Admitting: Family Medicine

## 2023-02-26 DIAGNOSIS — F988 Other specified behavioral and emotional disorders with onset usually occurring in childhood and adolescence: Secondary | ICD-10-CM

## 2023-02-27 ENCOUNTER — Ambulatory Visit: Admitting: Family Medicine

## 2023-02-28 ENCOUNTER — Other Ambulatory Visit: Payer: Self-pay | Admitting: Family Medicine

## 2023-02-28 DIAGNOSIS — F988 Other specified behavioral and emotional disorders with onset usually occurring in childhood and adolescence: Secondary | ICD-10-CM

## 2023-03-03 ENCOUNTER — Other Ambulatory Visit: Payer: Self-pay | Admitting: Family Medicine

## 2023-03-03 DIAGNOSIS — F988 Other specified behavioral and emotional disorders with onset usually occurring in childhood and adolescence: Secondary | ICD-10-CM

## 2023-03-03 MED ORDER — AMPHETAMINE-DEXTROAMPHET ER 30 MG PO CP24: 30.0000 mg | ORAL_CAPSULE | Freq: Every day | ORAL | 0 refills | Status: DC

## 2023-03-03 NOTE — Telephone Encounter (Signed)
Medication Refill - Medication:   Disp Refills Start End   amphetamine-dextroamphetamine (ADDERALL XR) 30 MG 24 hr capsule         Has the patient contacted their pharmacy? Yes.   (Agent: If no, request that the patient contact the pharmacy for the refill. If patient does not wish to contact the pharmacy document the reason why and proceed with request.) (Agent: If yes, when and what did the pharmacy advise?)call dr Pt has appt tomorrow but states will be out of med after tomorrow's dose and wants a refill now as his appt is in the afternoon, states wants refill before seeing dr.  Preferred Pharmacy (with phone number or street name):  [id number] [place], [place] - [address] AT [address] [facility] [address] Phone: [phone]  Fax: [phone]     Has the patient been seen for an appointment in the last year OR does the patient have an upcoming appointment? Yes.    Agent: Please be advised that RX refills may take up to 3 business days. We ask that you follow-up with your pharmacy. Advised!

## 2023-03-04 ENCOUNTER — Ambulatory Visit: Admitting: Family Medicine

## 2023-03-04 NOTE — Telephone Encounter (Signed)
Called pt to let him know that rx was sent to [facility] yesterday #30/0. [facility] so just asked pt to call back for clarification of which pharmacy.

## 2023-03-04 NOTE — Telephone Encounter (Signed)
Requested medication (s) are due for refill today: No  Requested medication (s) are on the active medication list: Yes  Last refill:  03/03/23  Future visit scheduled: No  Notes to clinic:  Requests different pharmacy.    Requested Prescriptions  Pending Prescriptions Disp Refills   amphetamine-dextroamphetamine (ADDERALL XR) 30 MG 24 hr capsule 30 capsule 0    Sig: Take 1 capsule (30 mg total) by mouth daily. dextroamphetamine-amphetamine 30 mg oral capsule, extended release Start Date: 07/16/19 Status: Ordered     Not Delegated - Psychiatry:  Stimulants/ADHD Failed - 03/03/2023 12:53 PM      Failed - This refill cannot be delegated      Failed - Urine Drug Screen completed in last 360 days      Passed - Last BP in normal range    BP Readings from Last 1 Encounters:  12/04/22 110/72         Passed - Last Heart Rate in normal range    Pulse Readings from Last 1 Encounters:  12/04/22 93         Passed - Valid encounter within last 6 months    Recent Outpatient Visits           3 months ago Attention deficit disorder, unspecified hyperactivity presence   [place] [facility], MD   6 months ago Right leg pain   [place] [facility] at [facility], MD   9 months ago Insomnia, unspecified type   [place] [facility], MD   1 year ago Insomnia, unspecified type   [place] [facility] at [facility], MD   1 year ago Insomnia, unspecified type   [facility] at [facility], MD

## 2023-03-05 DIAGNOSIS — Z419 Encounter for procedure for purposes other than remedying health state, unspecified: Secondary | ICD-10-CM | POA: Diagnosis not present

## 2023-03-27 ENCOUNTER — Other Ambulatory Visit: Payer: Self-pay | Admitting: Family Medicine

## 2023-03-27 DIAGNOSIS — F988 Other specified behavioral and emotional disorders with onset usually occurring in childhood and adolescence: Secondary | ICD-10-CM

## 2023-03-28 ENCOUNTER — Ambulatory Visit (INDEPENDENT_AMBULATORY_CARE_PROVIDER_SITE_OTHER): Admitting: Family Medicine

## 2023-03-28 ENCOUNTER — Encounter: Payer: Self-pay | Admitting: Family Medicine

## 2023-03-28 DIAGNOSIS — F988 Other specified behavioral and emotional disorders with onset usually occurring in childhood and adolescence: Secondary | ICD-10-CM

## 2023-03-28 MED ORDER — AMPHETAMINE-DEXTROAMPHET ER 30 MG PO CP24: 30.0000 mg | ORAL_CAPSULE | Freq: Every day | ORAL | 0 refills | Status: DC

## 2023-03-28 MED ORDER — TRAZODONE HCL 100 MG PO TABS: 100.0000 mg | ORAL_TABLET | Freq: Every evening | ORAL | 1 refills | Status: AC | PRN

## 2023-04-01 ENCOUNTER — Encounter: Payer: Self-pay | Admitting: Family Medicine

## 2023-04-01 NOTE — Progress Notes (Signed)
Established Patient Office Visit  Subjective    Patient ID: Chad Glenn, male    DOB: 1986/09/01  Age: 37 y.o. MRN: [id number]  CC: No chief complaint on file.   HPI Chad Glenn presents for follow up of ADHD. Patient denies acute complaints or concerns.    Outpatient Encounter Medications as of 03/28/2023  Medication Sig   cetirizine (ZYRTEC) 10 MG tablet Take 1 tablet (10 mg total) by mouth daily.   clonazePAM (KLONOPIN) 1 MG tablet clonazePAM 1 mg oral tablet Start Date: 05/21/19 Status: Ordered   tiZANidine (ZANAFLEX) 4 MG tablet Take 1 tablet (4 mg total) by mouth every 6 (six) hours as needed.   triamcinolone oint 0.025%-Cerave equivalent cream 1:1 mixture Apply topically 2 (two) times daily.   zolpidem (AMBIEN) 10 MG tablet zolpidem 10 mg oral tablet Start Date: 07/09/19 Status: Ordered   [DISCONTINUED] amphetamine-dextroamphetamine (ADDERALL XR) 30 MG 24 hr capsule Take 1 capsule (30 mg total) by mouth daily. dextroamphetamine-amphetamine 30 mg oral capsule, extended release Start Date: 07/16/19 Status: Ordered   [DISCONTINUED] traZODone (DESYREL) 100 MG tablet Take 1 tablet (100 mg total) by mouth at bedtime as needed for sleep.   amphetamine-dextroamphetamine (ADDERALL XR) 30 MG 24 hr capsule Take 1 capsule (30 mg total) by mouth daily. dextroamphetamine-amphetamine 30 mg oral capsule, extended release Start Date: 07/16/19 Status: Ordered   traZODone (DESYREL) 100 MG tablet Take 1 tablet (100 mg total) by mouth at bedtime as needed for sleep.   No facility-administered encounter medications on file as of 03/28/2023.    History reviewed. No pertinent past medical history.  History reviewed. No pertinent surgical history.  Family History  Problem Relation Age of Onset   Hypertension Mother    Hypertension Father    Heart failure Maternal Grandfather    Heart failure Paternal Grandmother     Social History   Socioeconomic History   Marital status: Married     Spouse name: Not on file   Number of children: Not on file   Years of education: Not on file   Highest education level: Associate degree: occupational, Scientist, product/process development, or vocational program  Occupational History   Not on file  Tobacco Use   Smoking status: Every Day    Types: Cigarettes   Smokeless tobacco: Never  Vaping Use   Vaping Use: Never used  Substance and Sexual Activity   Alcohol use: Not Currently   Drug use: Not Currently   Sexual activity: Not on file  Other Topics Concern   Not on file  Social History Narrative   Not on file   Social Determinants of Health   Financial Resource Strain: Low Risk  (03/03/2023)   Overall Financial Resource Strain (CARDIA)    Difficulty of Paying Living Expenses: Not hard at all  Food Insecurity: Food Insecurity Present (03/03/2023)   Hunger Vital Sign    Worried About Running Out of Food in the Last Year: Sometimes true    Ran Out of Food in the Last Year: Sometimes true  Transportation Needs: Unmet Transportation Needs (03/03/2023)   PRAPARE - Transportation    Lack of Transportation (Medical): Yes    Lack of Transportation (Non-Medical): Yes  Physical Activity: Sufficiently Active (03/03/2023)   Exercise Vital Sign    Days of Exercise per Week: 4 days    Minutes of Exercise per Session: 60 min  Stress: No Stress Concern Present (03/03/2023)   [employer] of Occupational Health - Occupational Stress Questionnaire  Feeling of Stress : Only a little  Social Connections: Unknown (03/03/2023)   Social Connection and Isolation Panel [NHANES]    Frequency of Communication with Friends and Family: Twice a week    Frequency of Social Gatherings with Friends and Family: Twice a week    Attends Religious Services: Patient declined    Database administrator or Organizations: No    Attends Engineer, structural: Not on file    Marital Status: Married  Catering manager Violence: Not on file    Review of Systems  All other  systems reviewed and are negative.       Objective    BP 131/78   Pulse 81   Temp 98.1 F (36.7 C) (Oral)   Resp 16   Wt 134 lb (60.8 kg)   SpO2 97%   BMI 22.30 kg/m   Physical Exam Vitals and nursing note reviewed.  Constitutional:      General: He is not in acute distress. Cardiovascular:     Rate and Rhythm: Normal rate and regular rhythm.  Pulmonary:     Effort: Pulmonary effort is normal.     Breath sounds: Normal breath sounds.  Abdominal:     Palpations: Abdomen is soft.     Tenderness: There is no abdominal tenderness.  Neurological:     General: No focal deficit present.     Mental Status: He is alert and oriented to person, place, and time.  Psychiatric:        Mood and Affect: Mood and affect normal.        Speech: Speech normal.        Behavior: Behavior normal. Behavior is not hyperactive. Behavior is cooperative.         Assessment & Plan:   1. Attention deficit disorder, unspecified hyperactivity presence Appears stable. Meds refilled.  - amphetamine-dextroamphetamine (ADDERALL XR) 30 MG 24 hr capsule; Take 1 capsule (30 mg total) by mouth daily. dextroamphetamine-amphetamine 30 mg oral capsule, extended release Start Date: 07/16/19 Status: Ordered  Dispense: 30 capsule; Refill: 0    Return in about 3 months (around 06/28/2023).   Chad Raymond, MD

## 2023-04-05 DIAGNOSIS — Z419 Encounter for procedure for purposes other than remedying health state, unspecified: Secondary | ICD-10-CM | POA: Diagnosis not present

## 2023-04-28 ENCOUNTER — Other Ambulatory Visit: Payer: Self-pay | Admitting: Family Medicine

## 2023-04-28 DIAGNOSIS — F988 Other specified behavioral and emotional disorders with onset usually occurring in childhood and adolescence: Secondary | ICD-10-CM

## 2023-04-29 ENCOUNTER — Other Ambulatory Visit: Payer: Self-pay | Admitting: Family Medicine

## 2023-04-29 DIAGNOSIS — F988 Other specified behavioral and emotional disorders with onset usually occurring in childhood and adolescence: Secondary | ICD-10-CM

## 2023-04-29 NOTE — Telephone Encounter (Unsigned)
Copied from CRM [id number]. Topic: General - Other >> Apr 29, 2023  4:44 PM Everette C wrote: Reason for CRM: Medication Refill - Medication: amphetamine-dextroamphetamine (ADDERALL XR) 30 MG 24 hr capsule [[id number]]  Has the patient contacted their pharmacy? Yes.   (Agent: If no, request that the patient contact the pharmacy for the refill. If patient does not wish to contact the pharmacy document the reason why and proceed with request.) (Agent: If yes, when and what did the pharmacy advise?)  Preferred Pharmacy (with phone number or street name): [facility] [address] ([address]), [place] - [address] [place] ([place] [address] Phone: [phone] Fax: [phone] Hours: Not open 24 hours   Has the patient been seen for an appointment in the last year OR does the patient have an upcoming appointment? Yes.    Agent: Please be advised that RX refills may take up to 3 business days. We ask that you follow-up with your pharmacy.

## 2023-04-30 NOTE — Telephone Encounter (Signed)
Requested medication (s) are due for refill today: yes  Requested medication (s) are on the active medication list: yes    Last refill: 03/28/23  #30  0 refills  Future visit scheduled yes 05/14/23  Notes to clinic:  Not delegated, please review. Thank you.  Requested Prescriptions  Pending Prescriptions Disp Refills   amphetamine-dextroamphetamine (ADDERALL XR) 30 MG 24 hr capsule 30 capsule 0    Sig: Take 1 capsule (30 mg total) by mouth daily. dextroamphetamine-amphetamine 30 mg oral capsule, extended release Start Date: 07/16/19 Status: Ordered     Not Delegated - Psychiatry:  Stimulants/ADHD Failed - 04/29/2023  5:47 PM      Failed - This refill cannot be delegated      Failed - Urine Drug Screen completed in last 360 days      Passed - Last BP in normal range    BP Readings from Last 1 Encounters:  03/28/23 131/78         Passed - Last Heart Rate in normal range    Pulse Readings from Last 1 Encounters:  03/28/23 81         Passed - Valid encounter within last 6 months    Recent Outpatient Visits           1 month ago Attention deficit disorder, unspecified hyperactivity presence   [place] [facility], [place]   4 months ago Attention deficit disorder, unspecified hyperactivity presence   [place] [facility], MD   8 months ago Right leg pain   [place] [facility], MD   11 months ago Insomnia, unspecified type   [place] [facility], MD   1 year ago Insomnia, unspecified type   [place] [facility], MD       Future Appointments             In 2 weeks Georganna Skeans, MD [facility]

## 2023-05-01 ENCOUNTER — Other Ambulatory Visit: Payer: Self-pay | Admitting: Family Medicine

## 2023-05-01 DIAGNOSIS — F988 Other specified behavioral and emotional disorders with onset usually occurring in childhood and adolescence: Secondary | ICD-10-CM

## 2023-05-01 MED ORDER — AMPHETAMINE-DEXTROAMPHET ER 30 MG PO CP24: 30.0000 mg | ORAL_CAPSULE | Freq: Every day | ORAL | 0 refills | Status: DC

## 2023-05-05 DIAGNOSIS — Z419 Encounter for procedure for purposes other than remedying health state, unspecified: Secondary | ICD-10-CM | POA: Diagnosis not present

## 2023-05-14 ENCOUNTER — Encounter: Admitting: Family Medicine

## 2023-05-26 ENCOUNTER — Other Ambulatory Visit: Payer: Self-pay | Admitting: Family Medicine

## 2023-05-26 DIAGNOSIS — F988 Other specified behavioral and emotional disorders with onset usually occurring in childhood and adolescence: Secondary | ICD-10-CM

## 2023-05-29 ENCOUNTER — Other Ambulatory Visit: Payer: Self-pay | Admitting: Family Medicine

## 2023-05-29 DIAGNOSIS — F988 Other specified behavioral and emotional disorders with onset usually occurring in childhood and adolescence: Secondary | ICD-10-CM

## 2023-05-29 NOTE — Telephone Encounter (Signed)
Medication Refill - Medication: amphetamine-dextroamphetamine (ADDERALL XR) 30 MG 24 hr capsule   Has the patient contacted their pharmacy? Yes.     Preferred Pharmacy (with phone number or street name): [facility] [address] ([address] [place] - [address]  [address] [place] ([place] [address]  Phone: [phone] Fax: [phone]  Hours: Not open 24 hours      Has the patient been seen for an appointment in the last year OR does the patient have an upcoming appointment? Yes.    Agent: Please be advised that RX refills may take up to 3 business days. We ask that you follow-up with your pharmacy.

## 2023-05-30 NOTE — Telephone Encounter (Signed)
Requested medication (s) are due for refill today - yes  Requested medication (s) are on the active medication list -yes  Future visit scheduled -yes  Last refill: 05/01/23 #30  Notes to clinic: non delegated Rx  Requested Prescriptions  Pending Prescriptions Disp Refills   amphetamine-dextroamphetamine (ADDERALL XR) 30 MG 24 hr capsule 30 capsule 0    Sig: Take 1 capsule (30 mg total) by mouth daily. dextroamphetamine-amphetamine 30 mg oral capsule, extended release Start Date: 07/16/19 Status: Ordered     Not Delegated - Psychiatry:  Stimulants/ADHD Failed - 05/29/2023  5:05 PM      Failed - This refill cannot be delegated      Failed - Urine Drug Screen completed in last 360 days      Passed - Last BP in normal range    BP Readings from Last 1 Encounters:  03/28/23 131/78         Passed - Last Heart Rate in normal range    Pulse Readings from Last 1 Encounters:  03/28/23 81         Passed - Valid encounter within last 6 months    Recent Outpatient Visits           2 months ago Attention deficit disorder, unspecified hyperactivity presence   [place] [facility], MD   5 months ago Attention deficit disorder, unspecified hyperactivity presence   [place] [facility], MD   9 months ago Right leg pain   [place] [facility], MD   1 year ago Insomnia, unspecified type   [place] [facility], MD   1 year ago Insomnia, unspecified type   [place] [facility], MD       Future Appointments             In 1 month Georganna Skeans, MD [facility]               Requested Prescriptions  Pending Prescriptions Disp Refills   amphetamine-dextroamphetamine (ADDERALL XR) 30 MG 24 hr capsule 30 capsule 0    Sig: Take 1 capsule (30 mg total) by mouth daily.  dextroamphetamine-amphetamine 30 mg oral capsule, extended release Start Date: 07/16/19 Status: Ordered     Not Delegated - Psychiatry:  Stimulants/ADHD Failed - 05/29/2023  5:05 PM      Failed - This refill cannot be delegated      Failed - Urine Drug Screen completed in last 360 days      Passed - Last BP in normal range    BP Readings from Last 1 Encounters:  03/28/23 131/78         Passed - Last Heart Rate in normal range    Pulse Readings from Last 1 Encounters:  03/28/23 81         Passed - Valid encounter within last 6 months    Recent Outpatient Visits           2 months ago Attention deficit disorder, unspecified hyperactivity presence   [place] [facility], MD   5 months ago Attention deficit disorder, unspecified hyperactivity presence   [place] [facility], MD   9 months ago Right leg pain   [place] [facility], MD  1 year ago Insomnia, unspecified type   [place] [facility], MD   1 year ago Insomnia, unspecified type   [place] [facility], MD       Future Appointments             In 1 month Georganna Skeans, MD [facility]

## 2023-06-05 ENCOUNTER — Other Ambulatory Visit: Payer: Self-pay | Admitting: Family Medicine

## 2023-06-05 DIAGNOSIS — Z419 Encounter for procedure for purposes other than remedying health state, unspecified: Secondary | ICD-10-CM | POA: Diagnosis not present

## 2023-06-05 DIAGNOSIS — F988 Other specified behavioral and emotional disorders with onset usually occurring in childhood and adolescence: Secondary | ICD-10-CM

## 2023-06-05 MED ORDER — AMPHETAMINE-DEXTROAMPHET ER 30 MG PO CP24: 30.0000 mg | ORAL_CAPSULE | Freq: Every day | ORAL | 0 refills | Status: DC

## 2023-06-27 ENCOUNTER — Other Ambulatory Visit: Payer: Self-pay | Admitting: Family Medicine

## 2023-06-27 DIAGNOSIS — F988 Other specified behavioral and emotional disorders with onset usually occurring in childhood and adolescence: Secondary | ICD-10-CM

## 2023-06-30 MED ORDER — AMPHETAMINE-DEXTROAMPHET ER 30 MG PO CP24
30.0000 mg | ORAL_CAPSULE | Freq: Every day | ORAL | 0 refills | Status: DC
Start: 2023-06-30 — End: 2023-07-28

## 2023-07-01 ENCOUNTER — Ambulatory Visit: Admitting: Family Medicine

## 2023-07-06 DIAGNOSIS — Z419 Encounter for procedure for purposes other than remedying health state, unspecified: Secondary | ICD-10-CM | POA: Diagnosis not present

## 2023-07-09 ENCOUNTER — Ambulatory Visit (INDEPENDENT_AMBULATORY_CARE_PROVIDER_SITE_OTHER): Admitting: Family Medicine

## 2023-07-09 ENCOUNTER — Encounter: Payer: Self-pay | Admitting: Family Medicine

## 2023-07-09 VITALS — BP 107/73 | HR 85 | Temp 98.1°F | Resp 16 | Ht 65.0 in | Wt 130.0 lb

## 2023-07-09 DIAGNOSIS — Z13220 Encounter for screening for lipoid disorders: Secondary | ICD-10-CM | POA: Diagnosis not present

## 2023-07-09 DIAGNOSIS — Z0000 Encounter for general adult medical examination without abnormal findings: Secondary | ICD-10-CM

## 2023-07-09 DIAGNOSIS — Z130 Encounter for screening for diseases of the blood and blood-forming organs and certain disorders involving the immune mechanism: Secondary | ICD-10-CM | POA: Diagnosis not present

## 2023-07-09 DIAGNOSIS — N5089 Other specified disorders of the male genital organs: Secondary | ICD-10-CM

## 2023-07-09 DIAGNOSIS — N529 Male erectile dysfunction, unspecified: Secondary | ICD-10-CM | POA: Diagnosis not present

## 2023-07-09 MED ORDER — SILDENAFIL CITRATE 100 MG PO TABS: 50.0000 mg | ORAL_TABLET | Freq: Every day | ORAL | 0 refills | Status: AC | PRN

## 2023-07-09 NOTE — Progress Notes (Unsigned)
Annual Exam

## 2023-07-09 NOTE — Progress Notes (Unsigned)
Established Patient Office Visit  Subjective    Patient ID: Chad Glenn, male    DOB: 08/26/1986  Age: 37 y.o. MRN: [id number]  CC:  Chief Complaint  Patient presents with   Annual Exam    HPI Chad Glenn presents for routine annual exam. Patient denies acute complaints but does want meds for ED  Outpatient Encounter Medications as of 07/09/2023  Medication Sig   amphetamine-dextroamphetamine (ADDERALL XR) 30 MG 24 hr capsule Take 1 capsule (30 mg total) by mouth daily. dextroamphetamine-amphetamine 30 mg oral capsule, extended release Start Date: 07/16/19 Status: Ordered   cetirizine (ZYRTEC) 10 MG tablet Take 1 tablet (10 mg total) by mouth daily.   clonazePAM (KLONOPIN) 1 MG tablet clonazePAM 1 mg oral tablet Start Date: 05/21/19 Status: Ordered   sildenafil (VIAGRA) 100 MG tablet Take 0.5-1 tablets (50-100 mg total) by mouth daily as needed for erectile dysfunction.   tiZANidine (ZANAFLEX) 4 MG tablet Take 1 tablet (4 mg total) by mouth every 6 (six) hours as needed.   traZODone (DESYREL) 100 MG tablet Take 1 tablet (100 mg total) by mouth at bedtime as needed for sleep.   triamcinolone oint 0.025%-Cerave equivalent cream 1:1 mixture Apply topically 2 (two) times daily.   zolpidem (AMBIEN) 10 MG tablet zolpidem 10 mg oral tablet Start Date: 07/09/19 Status: Ordered   No facility-administered encounter medications on file as of 07/09/2023.    History reviewed. No pertinent past medical history.  History reviewed. No pertinent surgical history.  Family History  Problem Relation Age of Onset   Hypertension Mother    Hypertension Father    Heart failure Maternal Grandfather    Heart failure Paternal Grandmother     Social History   Socioeconomic History   Marital status: Married    Spouse name: Not on file   Number of children: Not on file   Years of education: Not on file   Highest education level: Associate degree: occupational, Scientist, product/process development, or vocational program   Occupational History   Not on file  Tobacco Use   Smoking status: Every Day    Types: Cigarettes   Smokeless tobacco: Never  Vaping Use   Vaping status: Never Used  Substance and Sexual Activity   Alcohol use: Not Currently   Drug use: Not Currently   Sexual activity: Yes  Other Topics Concern   Not on file  Social History Narrative   Not on file   Social Determinants of Health   Financial Resource Strain: Low Risk  (03/03/2023)   Overall Financial Resource Strain (CARDIA)    Difficulty of Paying Living Expenses: Not hard at all  Food Insecurity: No Food Insecurity (07/09/2023)   Hunger Vital Sign    Worried About Running Out of Food in the Last Year: Never true    Ran Out of Food in the Last Year: Never true  Transportation Needs: No Transportation Needs (07/09/2023)   PRAPARE - Administrator, Civil Service (Medical): No    Lack of Transportation (Non-Medical): No  Physical Activity: Sufficiently Active (03/03/2023)   Exercise Vital Sign    Days of Exercise per Week: 4 days    Minutes of Exercise per Session: 60 min  Stress: No Stress Concern Present (07/09/2023)   [employer] of Occupational Health - Occupational Stress Questionnaire    Feeling of Stress : Not at all  Social Connections: Moderately Integrated (07/09/2023)   Social Connection and Isolation Panel [NHANES]    Frequency of Communication  with Friends and Family: Three times a week    Frequency of Social Gatherings with Friends and Family: Three times a week    Attends Religious Services: 1 to 4 times per year    Active Member of Clubs or Organizations: No    Attends Banker Meetings: Never    Marital Status: Married  Catering manager Violence: Not At Risk (07/09/2023)   Humiliation, Afraid, Rape, and Kick questionnaire    Fear of Current or Ex-Partner: No    Emotionally Abused: No    Physically Abused: No    Sexually Abused: No    Review of Systems  All other systems reviewed  and are negative.       Objective    BP 107/73 (BP Location: Right Arm, Patient Position: Sitting, Cuff Size: Normal)   Pulse 85   Temp 98.1 F (36.7 C) (Temporal)   Resp 16   Ht 5\' 5"  (1.651 m)   Wt 130 lb (59 kg)   SpO2 98%   BMI 21.63 kg/m   Physical Exam Vitals and nursing note reviewed.  Constitutional:      General: He is not in acute distress. HENT:     Head: Normocephalic and atraumatic.     Right Ear: Tympanic membrane, ear canal and external ear normal.     Left Ear: Tympanic membrane, ear canal and external ear normal.     Nose: Nose normal.     Mouth/Throat:     Mouth: Mucous membranes are moist.     Pharynx: Oropharynx is clear.  Eyes:     Conjunctiva/sclera: Conjunctivae normal.     Pupils: Pupils are equal, round, and reactive to light.  Neck:     Thyroid: No thyromegaly.  Cardiovascular:     Rate and Rhythm: Normal rate and regular rhythm.     Heart sounds: Normal heart sounds. No murmur heard. Pulmonary:     Effort: Pulmonary effort is normal.     Breath sounds: Normal breath sounds.  Abdominal:     General: There is no distension.     Palpations: Abdomen is soft. There is no mass.     Tenderness: There is no abdominal tenderness.     Hernia: There is no hernia in the left inguinal area or right inguinal area.  Genitourinary:    Penis: Normal.      Testes: Normal.     Epididymis:     Right: Mass present.     Left: Normal.  Musculoskeletal:        General: Normal range of motion.     Cervical back: Normal range of motion and neck supple.     Right lower leg: No edema.     Left lower leg: No edema.  Skin:    General: Skin is warm and dry.  Neurological:     General: No focal deficit present.     Mental Status: He is alert and oriented to person, place, and time. Mental status is at baseline.  Psychiatric:        Mood and Affect: Mood normal.        Behavior: Behavior normal.         Assessment & Plan:   Annual physical exam -      CMP14+EGFR -     Hepatitis C antibody -     HIV Antibody (routine testing w rflx)  Screening for deficiency anemia -     CBC with Differential/Platelet  Screening for lipid disorders -  Lipid panel  Erectile dysfunction, unspecified erectile dysfunction type -     Testosterone -     Ambulatory referral to Urology  Epididymal mass  Other orders -     Sildenafil Citrate; Take 0.5-1 tablets (50-100 mg total) by mouth daily as needed for erectile dysfunction.  Dispense: 30 tablet; Refill: 0     Return in about 3 months (around 10/08/2023) for follow up.   Tommie Raymond, MD

## 2023-07-10 ENCOUNTER — Encounter: Payer: Self-pay | Admitting: Family Medicine

## 2023-07-28 ENCOUNTER — Other Ambulatory Visit: Payer: Self-pay | Admitting: Family Medicine

## 2023-07-28 DIAGNOSIS — F988 Other specified behavioral and emotional disorders with onset usually occurring in childhood and adolescence: Secondary | ICD-10-CM

## 2023-07-29 ENCOUNTER — Telehealth: Payer: Self-pay | Admitting: *Deleted

## 2023-07-29 ENCOUNTER — Other Ambulatory Visit: Payer: Self-pay | Admitting: Family Medicine

## 2023-07-29 DIAGNOSIS — F988 Other specified behavioral and emotional disorders with onset usually occurring in childhood and adolescence: Secondary | ICD-10-CM

## 2023-07-29 MED ORDER — AMPHETAMINE-DEXTROAMPHET ER 30 MG PO CP24
30.0000 mg | ORAL_CAPSULE | Freq: Every day | ORAL | 0 refills | Status: AC
Start: 2023-07-29 — End: ?

## 2023-07-29 NOTE — Telephone Encounter (Signed)
Med refill

## 2023-07-31 ENCOUNTER — Telehealth: Payer: Self-pay | Admitting: Family Medicine

## 2023-07-31 NOTE — Telephone Encounter (Signed)
Call place to patient that meds have been ordered

## 2023-07-31 NOTE — Telephone Encounter (Signed)
Copied from CRM [id number]. Topic: General - Inquiry >> Jul 31, 2023  1:22 PM Marlow Baars wrote: Reason for CRM: The patient request a refill of his medication Adderrall on the 24th and truly needs this by tonight as he is going out of town in the morning. He is leaving late morning so if it has to be tomorrow if it can be first thing so he can take it with him. Please assist patient further

## 2023-08-05 DIAGNOSIS — Z419 Encounter for procedure for purposes other than remedying health state, unspecified: Secondary | ICD-10-CM | POA: Diagnosis not present

## 2023-08-07 ENCOUNTER — Other Ambulatory Visit: Payer: Self-pay | Admitting: Family Medicine

## 2023-08-07 ENCOUNTER — Other Ambulatory Visit

## 2023-08-07 ENCOUNTER — Telehealth: Payer: Self-pay | Admitting: *Deleted

## 2023-08-07 DIAGNOSIS — F909 Attention-deficit hyperactivity disorder, unspecified type: Secondary | ICD-10-CM

## 2023-08-07 MED ORDER — AMPHETAMINE-DEXTROAMPHET ER 30 MG PO CP24
30.0000 mg | ORAL_CAPSULE | Freq: Every day | ORAL | 0 refills | Status: DC
Start: 2023-08-07 — End: 2023-09-04

## 2023-08-07 NOTE — Telephone Encounter (Signed)
Med refill

## 2023-08-14 LAB — CBC WITH DIFFERENTIAL/PLATELET
Basophils Absolute: 0 10*3/uL (ref 0.0–0.2)
Basos: 0 %
EOS (ABSOLUTE): 0.1 10*3/uL (ref 0.0–0.4)
Eos: 2 %
Hematocrit: 49.2 % (ref 37.5–51.0)
Hemoglobin: 16.6 g/dL (ref 13.0–17.7)
Immature Grans (Abs): 0 10*3/uL (ref 0.0–0.1)
Immature Granulocytes: 0 %
Lymphocytes Absolute: 3 10*3/uL (ref 0.7–3.1)
Lymphs: 56 %
MCH: 31 pg (ref 26.6–33.0)
MCHC: 33.7 g/dL (ref 31.5–35.7)
MCV: 92 fL (ref 79–97)
Monocytes Absolute: 0.5 10*3/uL (ref 0.1–0.9)
Monocytes: 10 %
Neutrophils Absolute: 1.7 10*3/uL (ref 1.4–7.0)
Neutrophils: 32 %
Platelets: 255 10*3/uL (ref 150–450)
RBC: 5.35 x10E6/uL (ref 4.14–5.80)
RDW: 13.4 % (ref 11.6–15.4)
WBC: 5.4 10*3/uL (ref 3.4–10.8)

## 2023-08-14 LAB — SPECIMEN STATUS REPORT

## 2023-09-04 ENCOUNTER — Other Ambulatory Visit: Payer: Self-pay | Admitting: Family Medicine

## 2023-09-04 DIAGNOSIS — F909 Attention-deficit hyperactivity disorder, unspecified type: Secondary | ICD-10-CM

## 2023-09-04 MED ORDER — AMPHETAMINE-DEXTROAMPHET ER 30 MG PO CP24
30.0000 mg | ORAL_CAPSULE | Freq: Every day | ORAL | 0 refills | Status: DC
Start: 2023-09-04 — End: 2023-10-03

## 2023-09-05 DIAGNOSIS — Z419 Encounter for procedure for purposes other than remedying health state, unspecified: Secondary | ICD-10-CM | POA: Diagnosis not present

## 2023-10-03 ENCOUNTER — Other Ambulatory Visit: Payer: Self-pay | Admitting: Family Medicine

## 2023-10-03 DIAGNOSIS — F909 Attention-deficit hyperactivity disorder, unspecified type: Secondary | ICD-10-CM

## 2023-10-05 DIAGNOSIS — Z419 Encounter for procedure for purposes other than remedying health state, unspecified: Secondary | ICD-10-CM | POA: Diagnosis not present

## 2023-10-07 MED ORDER — AMPHETAMINE-DEXTROAMPHET ER 30 MG PO CP24
30.0000 mg | ORAL_CAPSULE | Freq: Every day | ORAL | 0 refills | Status: DC
Start: 2023-10-07 — End: 2023-10-27

## 2023-10-27 ENCOUNTER — Other Ambulatory Visit: Payer: Self-pay | Admitting: Family Medicine

## 2023-10-27 DIAGNOSIS — F909 Attention-deficit hyperactivity disorder, unspecified type: Secondary | ICD-10-CM

## 2023-10-28 MED ORDER — AMPHETAMINE-DEXTROAMPHET ER 30 MG PO CP24
30.0000 mg | ORAL_CAPSULE | Freq: Every day | ORAL | 0 refills | Status: DC
Start: 2023-10-28 — End: 2023-12-10

## 2023-11-05 DIAGNOSIS — Z419 Encounter for procedure for purposes other than remedying health state, unspecified: Secondary | ICD-10-CM | POA: Diagnosis not present

## 2023-12-06 DIAGNOSIS — Z419 Encounter for procedure for purposes other than remedying health state, unspecified: Secondary | ICD-10-CM | POA: Diagnosis not present

## 2023-12-10 ENCOUNTER — Other Ambulatory Visit: Payer: Self-pay | Admitting: Family Medicine

## 2023-12-10 DIAGNOSIS — F909 Attention-deficit hyperactivity disorder, unspecified type: Secondary | ICD-10-CM

## 2023-12-12 MED ORDER — AMPHETAMINE-DEXTROAMPHET ER 30 MG PO CP24
30.0000 mg | ORAL_CAPSULE | Freq: Every day | ORAL | 0 refills | Status: DC
Start: 2023-12-12 — End: 2024-01-21

## 2024-01-03 DIAGNOSIS — Z419 Encounter for procedure for purposes other than remedying health state, unspecified: Secondary | ICD-10-CM | POA: Diagnosis not present

## 2024-01-21 ENCOUNTER — Other Ambulatory Visit: Payer: Self-pay | Admitting: Family Medicine

## 2024-01-21 DIAGNOSIS — F909 Attention-deficit hyperactivity disorder, unspecified type: Secondary | ICD-10-CM

## 2024-01-22 MED ORDER — AMPHETAMINE-DEXTROAMPHET ER 30 MG PO CP24: 30.0000 mg | ORAL_CAPSULE | Freq: Every day | ORAL | 0 refills | Status: AC

## 2024-02-14 DIAGNOSIS — Z419 Encounter for procedure for purposes other than remedying health state, unspecified: Secondary | ICD-10-CM | POA: Diagnosis not present

## 2024-03-15 DIAGNOSIS — Z419 Encounter for procedure for purposes other than remedying health state, unspecified: Secondary | ICD-10-CM | POA: Diagnosis not present

## 2024-04-15 DIAGNOSIS — Z419 Encounter for procedure for purposes other than remedying health state, unspecified: Secondary | ICD-10-CM | POA: Diagnosis not present

## 2024-05-15 DIAGNOSIS — Z419 Encounter for procedure for purposes other than remedying health state, unspecified: Secondary | ICD-10-CM | POA: Diagnosis not present

## 2024-06-15 DIAGNOSIS — Z419 Encounter for procedure for purposes other than remedying health state, unspecified: Secondary | ICD-10-CM | POA: Diagnosis not present

## 2024-07-16 DIAGNOSIS — Z419 Encounter for procedure for purposes other than remedying health state, unspecified: Secondary | ICD-10-CM | POA: Diagnosis not present
